# Patient Record
Sex: Male | Born: 1980 | Hispanic: Yes | Marital: Married | Smoking: Former smoker
Health system: Southern US, Community
[De-identification: ages and names within clinical notes are randomized; demographics above are authoritative.]

## PROBLEM LIST (undated history)

## (undated) DIAGNOSIS — J069 Acute upper respiratory infection, unspecified: Secondary | ICD-10-CM

## (undated) HISTORY — PX: ANKLE SURGERY: SHX546

---
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
## 2002-01-01 ENCOUNTER — Emergency Department (HOSPITAL_COMMUNITY): Admission: EM | Admit: 2002-01-01 | Discharge: 2002-01-01 | Payer: Self-pay | Admitting: Internal Medicine

## 2002-01-27 ENCOUNTER — Emergency Department (HOSPITAL_COMMUNITY): Admission: EM | Admit: 2002-01-27 | Discharge: 2002-01-27 | Payer: Self-pay | Admitting: Emergency Medicine

## 2002-11-01 ENCOUNTER — Encounter: Payer: Self-pay | Admitting: Emergency Medicine

## 2002-11-01 ENCOUNTER — Emergency Department (HOSPITAL_COMMUNITY): Admission: EM | Admit: 2002-11-01 | Discharge: 2002-11-01 | Payer: Self-pay | Admitting: Emergency Medicine

## 2002-11-02 ENCOUNTER — Ambulatory Visit (HOSPITAL_BASED_OUTPATIENT_CLINIC_OR_DEPARTMENT_OTHER): Admission: RE | Admit: 2002-11-02 | Discharge: 2002-11-02 | Payer: Self-pay | Admitting: Orthopedic Surgery

## 2004-12-28 ENCOUNTER — Emergency Department (HOSPITAL_COMMUNITY): Admission: EM | Admit: 2004-12-28 | Discharge: 2004-12-28 | Payer: Self-pay | Admitting: Emergency Medicine

## 2005-06-14 ENCOUNTER — Emergency Department (HOSPITAL_COMMUNITY): Admission: EM | Admit: 2005-06-14 | Discharge: 2005-06-14 | Payer: Self-pay | Admitting: Family Medicine

## 2005-06-14 ENCOUNTER — Ambulatory Visit (HOSPITAL_COMMUNITY): Admission: RE | Admit: 2005-06-14 | Discharge: 2005-06-14 | Payer: Self-pay | Admitting: Family Medicine

## 2006-07-16 ENCOUNTER — Emergency Department (HOSPITAL_COMMUNITY): Admission: EM | Admit: 2006-07-16 | Discharge: 2006-07-16 | Payer: Self-pay | Admitting: Emergency Medicine

## 2006-11-13 ENCOUNTER — Emergency Department (HOSPITAL_COMMUNITY): Admission: EM | Admit: 2006-11-13 | Discharge: 2006-11-13 | Payer: Self-pay | Admitting: Emergency Medicine

## 2008-04-19 ENCOUNTER — Emergency Department (HOSPITAL_COMMUNITY): Admission: EM | Admit: 2008-04-19 | Discharge: 2008-04-19 | Payer: Self-pay | Admitting: Family Medicine

## 2010-07-18 NOTE — Op Note (Signed)
NAME:  Kevin [name], Kevin [name]                     ACCOUNT NO.:  [id number]   MEDICAL RECORD NO.:  [id number]                   PATIENT TYPE:  AMB   LOCATION:  [facility]                                  FACILITY:  [facility]   PHYSICIAN:  Kevin [name], M.D.                DATE OF BIRTH:  [birth year]   DATE OF PROCEDURE:  11/02/2002  DATE OF DISCHARGE:                                 OPERATIVE REPORT   PREOPERATIVE DIAGNOSIS:  Minimally displaced right ankle bimalleolar  fracture.   POSTOPERATIVE DIAGNOSIS:  Minimally displaced right ankle bimalleolar  fracture.   PROCEDURE:  Open reduction, internal fixation using [name] single 4 mm titanium  DePuy small fragment screw medially, on the lateral side two anterior to  posterior interfragment screws, and [name] 7-hole one-third tubular plate with  three distal cancellous screws and three proximal bicortical screws.   SURGEON:  Kevin [name], M.D.   FIRST ASSISTANT:  Kevin [name], P.[name].-C.   ANESTHETIC:  General endotracheal.   ESTIMATED BLOOD LOSS:  Minimal.   FLUID REPLACEMENT:  Liter of crystalloid.   DRAINS PLACED:  None.   TOURNIQUET TIME:  40 minutes.   INDICATIONS FOR PROCEDURE:  This 30 year old man who tripped over an object  yesterday and sustained [name] minimally displaced bimalleolar right ankle  fracture was seen at [facility] emergency room, placed in [name] soft dressing  and [name] Cam Walker boot for orthopedic evaluation the next day.  I met him in  the office at 9:00 this morning.  Because it was [name] bimalleolar, and  therefore, unstable ankle fracture, he was taken for open reduction,  internal fixation in order to decrease pain and increase function.   DESCRIPTION OF PROCEDURE:  The patient was identified by arm band and taken  to the operating room at [facility].  Appropriate anesthetic  monitors were attached, and general endotracheal anesthesia induced with the  patient in the supine position.  [name] tourniquet was applied high  to the left  leg, and the left lower extremity was prepped and draped in the usual  sterile fashion from the toes to the tourniquet.  The limb was wrapped with  an Esmarch bandage.  The tourniquet was inflated to 300 mmHg, and we began  the procedure on the medial side, making [name] longitudinal incision centered  over the tip of the medial malleolus and going proximally for about 3 cm and  distally for 2 cm.  The bleeders in the skin and subcutaneous tissue were  identified and cauterized.  We were careful to protect any branches of the  saphenous nerve or vein and cut down to the bone and then split the  periosteum anteriorly and posteriorly, exposing the fracture fragment, which  was actually in two pieces.  The major piece was then cleansed with  irrigation, removing blood clot and successfully reduced and then fixed with  [name] 4 mm fully threaded cancellous lag  screw.  C-arm images confirmed the  anatomic reduction of the fragment.  We then directed our attention to the  lateral side and made [name] longitudinal incision, starting out at the tip of  the lateral malleolus and going proximally for about 10 cm, cutting through  the skin and subcutaneous tissue, being careful to avoid any major branches  of the superficial peroneal nerve or the sternal nerve.  We cut down to the  periosteum over the fibula, which was again minimally displaced, identified  the fracture, and cleansed the fracture site with normal saline pulse  solution and removed small bits of clot with [name] dental probe.  Traction was  applied to the fibula, which was then reduced with [name] lion's jaw clamp and  fixed with two anterior to posterior cortical 3.5 mm titanium screws from  the DePuy small fragments, countersinking the heads.  [name] neutralization plate  was then conformed, seven holes, to the lateral aspect of the fibula.  Three  cancellous screws were placed distally, three bicortical or cortical screws  proximally.  Firm fixation  was accomplished.  C-arm images were taken,  confirming the anatomic reduction.  The tourniquet was let down.  Small  bleeders were identified and cauterized.  The wounds were then closed with  subcutaneous 2-0 Vicryl and in the skin running interlocking 3-0 nylon  suture.  [name] dressing with Xeroform, 4 x 4 dressings, sponges, Webril, and Ace  wrap applied, followed by [name] large Cam Walker boot.  The patient was then  awakened and taken to the recovery room without difficulty.                                               Kevin [name], M.D.    Ovid Curd  D:  11/02/2002  T:  11/03/2002  Job:  [id number]

## 2010-09-05 ENCOUNTER — Encounter: Payer: Self-pay | Admitting: Family Medicine

## 2010-10-23 ENCOUNTER — Encounter (HOSPITAL_COMMUNITY): Payer: Self-pay | Admitting: Emergency Medicine

## 2010-10-23 ENCOUNTER — Emergency Department (HOSPITAL_COMMUNITY)
Admission: EM | Admit: 2010-10-23 | Discharge: 2010-10-23 | Disposition: A | Discharge status: Home / Self Care | Payer: Self-pay | Attending: Emergency Medicine | Admitting: Emergency Medicine

## 2010-10-23 DIAGNOSIS — J329 Chronic sinusitis, unspecified: Secondary | ICD-10-CM | POA: Insufficient documentation

## 2010-10-23 DIAGNOSIS — R5381 Other malaise: Secondary | ICD-10-CM | POA: Insufficient documentation

## 2010-10-23 DIAGNOSIS — R5383 Other fatigue: Secondary | ICD-10-CM | POA: Insufficient documentation

## 2010-10-23 MED ORDER — PSEUDOEPHEDRINE HCL 60 MG PO TABS
60.0000 mg | ORAL_TABLET | Freq: Once | ORAL | Status: AC
  Administered 2010-10-23: 60 mg via ORAL
  Filled 2010-10-23: qty 1

## 2010-10-23 MED ORDER — PREDNISONE 10 MG PO TABS: ORAL_TABLET | ORAL | Status: DC

## 2010-10-23 MED ORDER — DOXYCYCLINE HYCLATE 100 MG PO CAPS: ORAL_CAPSULE | ORAL | Status: DC

## 2010-10-23 MED ORDER — FEXOFENADINE-PSEUDOEPHED ER 60-120 MG PO TB12: 1.0000 | ORAL_TABLET | Freq: Two times a day (BID) | ORAL | Status: DC

## 2010-10-23 MED ORDER — DOXYCYCLINE HYCLATE 100 MG PO TABS
100.0000 mg | ORAL_TABLET | Freq: Once | ORAL | Status: AC
  Administered 2010-10-23: 100 mg via ORAL
  Filled 2010-10-23: qty 1

## 2010-10-23 MED ORDER — PREDNISONE 20 MG PO TABS
60.0000 mg | ORAL_TABLET | Freq: Once | ORAL | Status: AC
  Administered 2010-10-23: 60 mg via ORAL
  Filled 2010-10-23: qty 3

## 2010-10-23 MED ORDER — HYDROCODONE-ACETAMINOPHEN 7.5-325 MG PO TABS: 1.0000 | ORAL_TABLET | ORAL | Status: AC | PRN

## 2010-10-23 MED ORDER — ONDANSETRON HCL 4 MG PO TABS
4.0000 mg | ORAL_TABLET | Freq: Once | ORAL | Status: AC
  Administered 2010-10-23: 4 mg via ORAL
  Filled 2010-10-23: qty 1

## 2010-10-23 NOTE — ED Notes (Signed)
Patient c/o headache, nasal congestion and flu-like symptoms x 2 days.  States has been taking Nyquil at home with no relief.

## 2010-10-23 NOTE — ED Provider Notes (Signed)
History     CSN: [id number] Arrival date & time: 10/23/2010  9:24 PM  Chief Complaint  Patient presents with  . Nasal Congestion  . Emesis  . Headache  . URI   Patient is a 30 y.o. male presenting with vomiting, headaches, and URI. The history is provided by the patient.  Emesis  This is a new problem. The current episode started more than 2 days ago. The problem has not changed since onset.There has been no fever. Associated symptoms include headaches and URI. Pertinent negatives include no abdominal pain, no arthralgias and no cough.  Headache  This is a new problem. The current episode started 2 days ago. The problem occurs constantly. The problem has been gradually worsening. The pain is located in the right unilateral and frontal region. The quality of the pain is described as throbbing. The pain is moderate. Associated symptoms include malaise/fatigue and vomiting. Pertinent negatives include no near-syncope, no palpitations, no syncope and no shortness of breath. He has tried nothing for the symptoms.  URI The primary symptoms include headaches and vomiting. Primary symptoms do not include cough, wheezing, abdominal pain or arthralgias.  The headache is not associated with photophobia.  Symptoms associated with the illness include congestion.    History reviewed. No pertinent past medical history.  Past Surgical History  Procedure Date  . Ankle surgery     No family history on file.  History  Substance Use Topics  . Smoking status: Former Games developer  . Smokeless tobacco: Not on file  . Alcohol Use: No      Review of Systems  Constitutional: Positive for malaise/fatigue. Negative for activity change.       All ROS Neg except as noted in HPI  HENT: Positive for congestion and dental problem. Negative for nosebleeds and neck pain.   Eyes: Negative for photophobia and discharge.  Respiratory: Negative for cough, shortness of breath and wheezing.   Cardiovascular: Negative  for chest pain, palpitations, syncope and near-syncope.  Gastrointestinal: Positive for vomiting. Negative for abdominal pain and blood in stool.  Genitourinary: Negative for dysuria, frequency and hematuria.  Musculoskeletal: Negative for back pain and arthralgias.  Skin: Negative.   Neurological: Positive for headaches. Negative for dizziness, seizures and speech difficulty.  Psychiatric/Behavioral: Negative for hallucinations and confusion.    Physical Exam  BP 150/98  Pulse 99  Temp(Src) 98.6 F (37 C) (Oral)  Resp 20  Ht 5\' 9"  (1.753 m)  Wt 205 lb (92.987 kg)  BMI 30.27 kg/m2  SpO2 99%  Physical Exam  Nursing note and vitals reviewed. Constitutional: He is oriented to person, place, and time. He appears well-developed and well-nourished.  Non-toxic appearance.  HENT:  Head: Normocephalic.  Right Ear: Tympanic membrane and external ear normal.  Left Ear: Tympanic membrane and external ear normal.       Pain to percussion of the right frontal sinus area. Nasal congestion noted.  Eyes: EOM and lids are normal. Pupils are equal, round, and reactive to light.  Neck: Normal range of motion. Neck supple. Carotid bruit is not present.  Cardiovascular: Normal rate, regular rhythm, normal heart sounds, intact distal pulses and normal pulses.   Pulmonary/Chest: Breath sounds normal. No respiratory distress.  Abdominal: Soft. Bowel sounds are normal. There is no tenderness. There is no guarding.  Musculoskeletal: Normal range of motion.  Lymphadenopathy:       Head (right side): No submandibular adenopathy present.       Head (left side): No submandibular  adenopathy present.    He has no cervical adenopathy.  Neurological: He is alert and oriented to person, place, and time. He has normal strength. No cranial nerve deficit or sensory deficit.  Skin: Skin is warm and dry.  Psychiatric: He has a normal mood and affect. His speech is normal.    ED Course  Procedures  Plan:  Increase fluids. Tylenol for fever and aching. Doxycycline, allegra D, prednisone.      Kathie Dike, [place] 10/23/10 2156

## 2010-10-24 NOTE — ED Provider Notes (Signed)
Medical screening examination/treatment/procedure(s) were performed by non-physician practitioner and as supervising physician I was immediately available for consultation/collaboration.   Lyanne Co, MD 10/24/10 Kevin [name]

## 2011-05-07 ENCOUNTER — Other Ambulatory Visit (HOSPITAL_COMMUNITY): Payer: Self-pay | Admitting: Otolaryngology

## 2011-05-08 ENCOUNTER — Encounter (HOSPITAL_COMMUNITY)
Admission: RE | Admit: 2011-05-08 | Discharge: 2011-05-08 | Disposition: A | Discharge status: Home / Self Care | Payer: Self-pay | Source: Ambulatory Visit | Attending: Otolaryngology | Admitting: Otolaryngology

## 2011-05-08 ENCOUNTER — Encounter (HOSPITAL_COMMUNITY): Payer: Self-pay

## 2011-05-08 ENCOUNTER — Encounter (HOSPITAL_COMMUNITY): Payer: Self-pay | Admitting: Pharmacy Technician

## 2011-05-08 HISTORY — DX: Acute upper respiratory infection, unspecified: J06.9

## 2011-05-08 LAB — CBC
HCT: 42.2 % (ref 39.0–52.0)
MCV: 86.1 fL (ref 78.0–100.0)
Platelets: 301 10*3/uL (ref 150–400)
RBC: 4.9 MIL/uL (ref 4.22–5.81)
WBC: 11 10*3/uL — ABNORMAL HIGH (ref 4.0–10.5)

## 2011-05-08 NOTE — Pre-Procedure Instructions (Signed)
20 Clemence A Richter  05/08/2011   Your procedure is scheduled on:  05/11/2011  Report to [facility] at 0530 AM.  Call this number if you have problems the morning of surgery: [phone]   Remember:   Do not eat food:After Midnight.  May have clear liquids: up to 4 Hours before arrival.DO NOT DRINK ANYTHING AFTER 0130  AM THE DAY OF SURGERY.   Clear liquids include soda, tea, black coffee, apple or grape juice, broth.  Take these medicines the morning of surgery with A SIP OF WATER: NONE   Do not wear jewelry, make-up or nail polish.  Do not wear lotions, powders, or perfumes. You may wear deodorant.  Do not shave 48 hours prior to surgery.  Do not bring valuables to the hospital.  Contacts, dentures or bridgework may not be worn into surgery.  Leave suitcase in the car. After surgery it may be brought to your room.  For patients admitted to the hospital, checkout time is 11:00 AM the day of discharge.   Patients discharged the day of surgery will not be allowed to drive home.  Name and phone number of your driver: SISTER IN LAW WILL BE WITH PT ... EVA HERNANDEZ  Special Instructions: CHG Shower Use Special Wash: 1/2 bottle night before surgery and 1/2 bottle morning of surgery.   Please read over the following fact sheets that you were given: Pain Booklet, Coughing and Deep Breathing, MRSA Information and Surgical Site Infection Prevention

## 2011-05-11 ENCOUNTER — Encounter (HOSPITAL_COMMUNITY): Payer: Self-pay | Admitting: Anesthesiology

## 2011-05-11 ENCOUNTER — Encounter (HOSPITAL_COMMUNITY)
Admission: RE | Disposition: A | Discharge status: Home / Self Care | Payer: Self-pay | Source: Ambulatory Visit | Attending: Otolaryngology

## 2011-05-11 ENCOUNTER — Ambulatory Visit (HOSPITAL_COMMUNITY): Payer: Self-pay | Admitting: Anesthesiology

## 2011-05-11 ENCOUNTER — Ambulatory Visit (HOSPITAL_COMMUNITY)
Admission: RE | Admit: 2011-05-11 | Discharge: 2011-05-11 | Disposition: A | Discharge status: Home / Self Care | Payer: Self-pay | Source: Ambulatory Visit | Attending: Otolaryngology | Admitting: Otolaryngology

## 2011-05-11 ENCOUNTER — Encounter (HOSPITAL_COMMUNITY): Payer: Self-pay | Admitting: *Deleted

## 2011-05-11 DIAGNOSIS — J3501 Chronic tonsillitis: Secondary | ICD-10-CM | POA: Diagnosis present

## 2011-05-11 DIAGNOSIS — Z01812 Encounter for preprocedural laboratory examination: Secondary | ICD-10-CM | POA: Insufficient documentation

## 2011-05-11 DIAGNOSIS — J351 Hypertrophy of tonsils: Secondary | ICD-10-CM | POA: Diagnosis present

## 2011-05-11 DIAGNOSIS — F17200 Nicotine dependence, unspecified, uncomplicated: Secondary | ICD-10-CM | POA: Insufficient documentation

## 2011-05-11 HISTORY — PX: TONSILLECTOMY: SHX5217

## 2011-05-11 SURGERY — TONSILLECTOMY
Anesthesia: General | Wound class: Clean Contaminated

## 2011-05-11 MED ORDER — SUFENTANIL CITRATE 50 MCG/ML IV SOLN
INTRAVENOUS | Status: DC | PRN
  Administered 2011-05-11: 5 ug via INTRAVENOUS
  Administered 2011-05-11: 15 ug via INTRAVENOUS
  Administered 2011-05-11: 5 ug via INTRAVENOUS

## 2011-05-11 MED ORDER — DEXAMETHASONE SODIUM PHOSPHATE 10 MG/ML IJ SOLN
INTRAMUSCULAR | Status: DC | PRN
  Administered 2011-05-11: 10 mg via INTRAVENOUS

## 2011-05-11 MED ORDER — HYDROMORPHONE HCL PF 1 MG/ML IJ SOLN
0.2500 mg | INTRAMUSCULAR | Status: DC | PRN
Start: 2011-05-11 — End: 2011-05-11

## 2011-05-11 MED ORDER — SODIUM CHLORIDE 0.9 % IR SOLN
Status: DC | PRN
  Administered 2011-05-11: 1000 mL

## 2011-05-11 MED ORDER — MEPERIDINE HCL 25 MG/ML IJ SOLN: 6.2500 mg | INTRAMUSCULAR | Status: DC | PRN

## 2011-05-11 MED ORDER — LACTATED RINGERS IV SOLN
INTRAVENOUS | Status: DC | PRN
  Administered 2011-05-11: 07:00:00 via INTRAVENOUS

## 2011-05-11 MED ORDER — HYDROCODONE-ACETAMINOPHEN 7.5-500 MG/15ML PO SOLN
ORAL | Status: AC
  Filled 2011-05-11: qty 15

## 2011-05-11 MED ORDER — PROMETHAZINE HCL 25 MG/ML IJ SOLN: 6.2500 mg | INTRAMUSCULAR | Status: DC | PRN

## 2011-05-11 MED ORDER — LIDOCAINE HCL (CARDIAC) 20 MG/ML IV SOLN
INTRAVENOUS | Status: DC | PRN
  Administered 2011-05-11: 80 mg via INTRAVENOUS

## 2011-05-11 MED ORDER — ONDANSETRON HCL 4 MG/2ML IJ SOLN
INTRAMUSCULAR | Status: DC | PRN
  Administered 2011-05-11: 4 mg via INTRAVENOUS

## 2011-05-11 MED ORDER — ROCURONIUM BROMIDE 100 MG/10ML IV SOLN
INTRAVENOUS | Status: DC | PRN
  Administered 2011-05-11: 35 mg via INTRAVENOUS

## 2011-05-11 MED ORDER — HYDROCODONE-ACETAMINOPHEN 7.5-500 MG/15ML PO SOLN
10.0000 mL | ORAL | Status: DC | PRN
  Administered 2011-05-11 (×2): 15 mL via ORAL

## 2011-05-11 MED ORDER — GLYCOPYRROLATE 0.2 MG/ML IJ SOLN
INTRAMUSCULAR | Status: DC | PRN
  Administered 2011-05-11: .8 mg via INTRAVENOUS

## 2011-05-11 MED ORDER — PROPOFOL 10 MG/ML IV EMUL
INTRAVENOUS | Status: DC | PRN
  Administered 2011-05-11: 200 mg via INTRAVENOUS

## 2011-05-11 MED ORDER — HYDROCODONE-ACETAMINOPHEN 7.5-500 MG/15ML PO SOLN: 10.0000 mL | ORAL | Status: AC | PRN

## 2011-05-11 MED ORDER — MIDAZOLAM HCL 5 MG/5ML IJ SOLN
INTRAMUSCULAR | Status: DC | PRN
  Administered 2011-05-11: 1 mg via INTRAVENOUS

## 2011-05-11 MED ORDER — NEOSTIGMINE METHYLSULFATE 1 MG/ML IJ SOLN
INTRAMUSCULAR | Status: DC | PRN
  Administered 2011-05-11: 5 mg via INTRAVENOUS

## 2011-05-11 SURGICAL SUPPLY — 32 items
CANISTER SUCTION 2500CC (MISCELLANEOUS) ×2 IMPLANT
CATH ROBINSON RED A/P 10FR (CATHETERS) IMPLANT
CLEANER TIP ELECTROSURG 2X2 (MISCELLANEOUS) IMPLANT
CLOTH BEACON ORANGE TIMEOUT ST (SAFETY) ×2 IMPLANT
COAGULATOR SUCT SWTCH 10FR 6 (ELECTROSURGICAL) ×2 IMPLANT
CRADLE DONUT ADULT HEAD (MISCELLANEOUS) ×2 IMPLANT
ELECT COATED BLADE 2.86 ST (ELECTRODE) ×2 IMPLANT
ELECT REM PT RETURN 9FT ADLT (ELECTROSURGICAL) ×2
ELECT REM PT RETURN 9FT PED (ELECTROSURGICAL)
ELECTRODE REM PT RETRN 9FT PED (ELECTROSURGICAL) IMPLANT
ELECTRODE REM PT RTRN 9FT ADLT (ELECTROSURGICAL) ×1 IMPLANT
GAUZE SPONGE 4X4 16PLY XRAY LF (GAUZE/BANDAGES/DRESSINGS) ×2 IMPLANT
GLOVE BIO SURGEON STRL SZ7.5 (GLOVE) ×2 IMPLANT
GLOVE ECLIPSE 6.5 STRL STRAW (GLOVE) ×2 IMPLANT
GOWN STRL NON-REIN LRG LVL3 (GOWN DISPOSABLE) ×4 IMPLANT
KIT BASIN OR (CUSTOM PROCEDURE TRAY) ×2 IMPLANT
KIT ROOM TURNOVER OR (KITS) ×2 IMPLANT
NS IRRIG 1000ML POUR BTL (IV SOLUTION) ×2 IMPLANT
PACK SURGICAL SETUP 50X90 (CUSTOM PROCEDURE TRAY) ×2 IMPLANT
PAD ARMBOARD 7.5X6 YLW CONV (MISCELLANEOUS) ×4 IMPLANT
PENCIL BUTTON HOLSTER BLD 10FT (ELECTRODE) ×2 IMPLANT
SPECIMEN JAR SMALL (MISCELLANEOUS) IMPLANT
SPONGE TONSIL 1.25 RF SGL STRG (GAUZE/BANDAGES/DRESSINGS) ×2 IMPLANT
SYR BULB 3OZ (MISCELLANEOUS) ×2 IMPLANT
TOWEL OR 17X24 6PK STRL BLUE (TOWEL DISPOSABLE) ×4 IMPLANT
TUBE CONNECTING 12X1/4 (SUCTIONS) ×2 IMPLANT
TUBE SALEM SUMP 10F W/ARV (TUBING) IMPLANT
TUBE SALEM SUMP 12R W/ARV (TUBING) IMPLANT
TUBE SALEM SUMP 14F W/ARV (TUBING) ×2 IMPLANT
TUBE SALEM SUMP 16 FR W/ARV (TUBING) IMPLANT
WATER STERILE IRR 1000ML POUR (IV SOLUTION) ×2 IMPLANT
YANKAUER SUCT BULB TIP NO VENT (SUCTIONS) ×2 IMPLANT
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
## 2011-05-11 NOTE — Anesthesia Preprocedure Evaluation (Addendum)
Anesthesia Evaluation  Patient identified by MRN, date of birth, ID band Patient awake    Reviewed: Allergy & Precautions, H&P , NPO status , Patient's Chart, lab work & pertinent test results, reviewed documented beta blocker date and time   Airway Mallampati: I TM Distance: >3 FB Neck ROM: Full    Dental  (+) Teeth Intact and Chipped   Pulmonary Recent URI , Resolved,  breath sounds clear to auscultation        Cardiovascular Rhythm:Regular Rate:Normal     Neuro/Psych    GI/Hepatic   Endo/Other    Renal/GU      Musculoskeletal   Abdominal   Peds  Hematology   Anesthesia Other Findings Two incisors chipped  Reproductive/Obstetrics                        Anesthesia Physical Anesthesia Plan  ASA: I  Anesthesia Plan: General   Post-op Pain Management:    Induction: Intravenous  Airway Management Planned: Oral ETT  Additional Equipment:   Intra-op Plan:   Post-operative Plan: Extubation in OR  Informed Consent: I have reviewed the patients History and Physical, chart, labs and discussed the procedure including the risks, benefits and alternatives for the proposed anesthesia with the patient or authorized representative who has indicated his/her understanding and acceptance.     Plan Discussed with: CRNA and Surgeon  Anesthesia Plan Comments:         Anesthesia Quick Evaluation

## 2011-05-11 NOTE — Preoperative (Signed)
Beta Blockers   Reason not to administer Beta Blockers:Not Applicable 

## 2011-05-11 NOTE — OR Nursing (Signed)
HAS JUST COMPLETED ENTIRE CLEAR LIQUID TRAY + OTHER CLEARS WITHOUT PROBLEMS. oob X 2 TODAY TO VOID. PAIN IS RELIABLY RELIEVED WITH ORAL HYDROCODONE  SOLN.  REMAINS OFF MONITORS.

## 2011-05-11 NOTE — Anesthesia Postprocedure Evaluation (Signed)
  Anesthesia Post-op Note  Patient: Kevin [name]  Procedure(s) Performed: Procedure(s) (LRB): TONSILLECTOMY (N/A)  Patient Location: PACU  Anesthesia Type: General  Level of Consciousness: awake  Airway and Oxygen Therapy: Patient Spontanous Breathing  Post-op Pain: mild  Post-op Assessment: Post-op Vital signs reviewed  Post-op Vital Signs: stable  Complications: No apparent anesthesia complications

## 2011-05-11 NOTE — Brief Op Note (Signed)
05/11/2011  8:07 AM  PATIENT:  Kevin [name]  31 y.o. male  PRE-OPERATIVE DIAGNOSIS:  Chronic Tonsillitis, tonsillar hypertrophy  POST-OPERATIVE DIAGNOSIS:  Chronic Tonsillitis, tonsillar hypertrophy  PROCEDURE:  Procedure(s) (LRB): TONSILLECTOMY (N/A)  SURGEON:  Surgeon(s) and Role:    * Christia Reading, MD - Primary  PHYSICIAN ASSISTANT:   ASSISTANTS: none   ANESTHESIA:   general  EBL:     BLOOD ADMINISTERED:none  DRAINS: none   LOCAL MEDICATIONS USED:  NONE  SPECIMEN:  Source of Specimen:  Right and left tonsils  DISPOSITION OF SPECIMEN:  PATHOLOGY  COUNTS:  YES  TOURNIQUET:  * No tourniquets in log *  DICTATION: .Other Dictation: Dictation Number [id number]  PLAN OF CARE: Admit for overnight observation  PATIENT DISPOSITION:  PACU - hemodynamically stable.   Delay start of Pharmacological VTE agent (>24hrs) due to surgical blood loss or risk of bleeding: no

## 2011-05-11 NOTE — Discharge Summary (Signed)
Physician Discharge Summary  Patient ID: Kevin [name] MRN: [id number] DOB/AGE: 06/03/1980 30 y.o.  Admit date: 05/11/2011 Discharge date: 05/11/2011  Admission Diagnoses: Chronic tonsillitis, tonsillar hypertrophy  Discharge Diagnoses:  Principal Problem:  *Chronic tonsillitis Active Problems:  Hypertrophy of tonsil   Discharged Condition: good  Hospital Course: 31 year old underwent tonsillectomy for above diagnoses.  See operative note.  Was observed for several hours after surgery in the PACU and was drinking easily with pain well-controlled.  Felt stable for discharge home.  Consults: None  Significant Diagnostic Studies: None  Treatments: IV hydration  Discharge Exam: Blood pressure 140/100, pulse 61, temperature 97.6 F (36.4 C), temperature source Oral, resp. rate 10, SpO2 100.00%. General appearance: alert, cooperative and no distress Throat: no bleeding  Disposition: 01-Home or Self Care  Discharge Orders    Future Orders Please Complete By Expires   Diet - low sodium heart healthy      Increase activity slowly      Discharge instructions      Comments:   Drink plenty of fluids.  Use pain medicine to help control pain.  Call if you cannot drink, have bleeding from your throat, or have high fever.     Medication List  As of 05/11/2011  1:40 PM   TAKE these medications         HYDROcodone-acetaminophen 7.5-500 MG/15ML solution   Commonly known as: LORTAB   Take 10-15 mLs by mouth every 4 (four) hours as needed.           Follow-up Information    Follow up with Ziyon Soltau, MD. Schedule an appointment as soon as possible for a visit in 4 weeks.   Contact information:   [facility] [address] [place] [address] [phone]          Signed: Christia Reading 05/11/2011, 1:40 PM

## 2011-05-11 NOTE — Op Note (Signed)
NAMEBRAYAN, [name] NO.:  [id number]  MEDICAL RECORD NO.:  [id number]  LOCATION:  [facility]                         FACILITY:  [facility]  PHYSICIAN:  Antony Contras, MD     DATE OF BIRTH:  1980-12-14  DATE OF PROCEDURE:  05/11/2011 DATE OF DISCHARGE:  05/11/2011                              OPERATIVE REPORT   PREOPERATIVE DIAGNOSIS:  Chronic tonsillitis and tonsillar hypertrophy.  POSTOPERATIVE DIAGNOSIS:  Chronic tonsillitis and tonsillar hypertrophy.  PROCEDURE:  Tonsillectomy.  SURGEON:  Excell Seltzer. Jenne Pane, M.D.  ANESTHESIA:  General endotracheal anesthesia.  COMPLICATIONS:  None.  INDICATION:  The patient is a 31 year old male who has a long history of tonsil enlargement causing obstructive symptoms as well as frequent throat pain.  He presents to the operating for surgical management.  FINDINGS:  Tonsils are 4+.  DESCRIPTION OF PROCEDURE:  The patient was identified in the holding room and an informed consent having been obtained after discussion of risks, benefits, alternatives, the patient was brought to the operative suite and put on the operative table in supine position.  Anesthesia was induced and the patient was intubated by the anesthesia team without difficulty.  The patient was given intravenous steroids during the case. The eyes were taped, closed, and bed was turned 90 degrees from Anesthesia.  A head wrap was placed around the patient's head and a Crowe-Davis retractor was inserted in the mouth and opened via the oropharynx.  This was placed in suspension on the Mayo stand.  The right tonsil was grasped with a curved Allis and retracted medially while a curvilinear incision was made along the anterior tonsillar pillar using Bovie electrocautery on a setting of 30.  Dissection continued in the subcapsular plane until the tonsil was removed.  The same procedure was then carried out on the left side.  Tonsils were passed separately for Pathology.   Bleeding was then controlled in each site using suction cautery on a setting of 30.  After this was completed, the nose and throat were copiously irrigated with saline and a flexible catheter was passed down the esophagus to suck at the stomach and esophagus.  Retractor was then taken out of suspension and removed from the patient's mouth.  He was turned back to Anesthesia for wake up.  He was extubated and moved to recovery room in stable condition.     Antony Contras, MD     DDB/MEDQ  D:  05/11/2011  T:  05/11/2011  Job:  [id number]

## 2011-05-11 NOTE — Transfer of Care (Signed)
Immediate Anesthesia Transfer of Care Note  Patient: Kevin [name]  Procedure(s) Performed: Procedure(s) (LRB): TONSILLECTOMY (N/A)  Patient Location: PACU  Anesthesia Type: General  Level of Consciousness: awake, alert , oriented and patient cooperative  Airway & Oxygen Therapy: Patient Spontanous Breathing and Patient connected to nasal cannula oxygen  Post-op Assessment: Report given to PACU RN and Post -op Vital signs reviewed and stable  Post vital signs: Reviewed and stable  Complications: No apparent anesthesia complications

## 2011-05-11 NOTE — Progress Notes (Signed)
Report to [employer] as caregiver

## 2011-05-18 ENCOUNTER — Encounter (HOSPITAL_COMMUNITY): Payer: Self-pay | Admitting: Otolaryngology

## 2014-04-18 ENCOUNTER — Encounter (HOSPITAL_COMMUNITY): Payer: Self-pay | Admitting: Emergency Medicine

## 2014-04-18 ENCOUNTER — Emergency Department (HOSPITAL_COMMUNITY)
Admission: EM | Admit: 2014-04-18 | Discharge: 2014-04-19 | Discharge status: Against Medical Advice | Payer: Self-pay | Attending: Emergency Medicine | Admitting: Emergency Medicine

## 2014-04-18 DIAGNOSIS — Y998 Other external cause status: Secondary | ICD-10-CM | POA: Insufficient documentation

## 2014-04-18 DIAGNOSIS — S0990XA Unspecified injury of head, initial encounter: Secondary | ICD-10-CM | POA: Insufficient documentation

## 2014-04-18 DIAGNOSIS — Y9389 Activity, other specified: Secondary | ICD-10-CM | POA: Insufficient documentation

## 2014-04-18 DIAGNOSIS — Y92410 Unspecified street and highway as the place of occurrence of the external cause: Secondary | ICD-10-CM | POA: Insufficient documentation

## 2014-04-18 NOTE — ED Notes (Signed)
Pt was called back to room and there was no answer.

## 2014-04-18 NOTE — ED Notes (Signed)
Pt called and no answer.  
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
## 2014-04-19 NOTE — ED Notes (Signed)
Pt was called but no answer.

## 2014-06-27 ENCOUNTER — Other Ambulatory Visit (HOSPITAL_COMMUNITY): Payer: Self-pay | Admitting: Orthopaedic Surgery

## 2014-06-27 DIAGNOSIS — M25532 Pain in left wrist: Secondary | ICD-10-CM

## 2014-07-13 ENCOUNTER — Ambulatory Visit (HOSPITAL_COMMUNITY)
Admission: RE | Admit: 2014-07-13 | Discharge: 2014-07-13 | Disposition: A | Discharge status: Home / Self Care | Payer: Self-pay | Source: Ambulatory Visit | Attending: Orthopaedic Surgery | Admitting: Orthopaedic Surgery

## 2014-07-13 DIAGNOSIS — M25532 Pain in left wrist: Secondary | ICD-10-CM

## 2014-07-13 MED ORDER — LIDOCAINE HCL (PF) 1 % IJ SOLN
INTRAMUSCULAR | Status: AC
  Filled 2014-07-13: qty 5

## 2014-07-13 MED ORDER — IOHEXOL 180 MG/ML  SOLN
20.0000 mL | Freq: Once | INTRAMUSCULAR | Status: AC | PRN
  Administered 2014-07-13: 3 mL via INTRA_ARTICULAR

## 2014-07-13 MED ORDER — GADOBENATE DIMEGLUMINE 529 MG/ML IV SOLN
5.0000 mL | Freq: Once | INTRAVENOUS | Status: AC | PRN
  Administered 2014-07-13: 0.05 mL via INTRAVENOUS

## 2015-04-13 ENCOUNTER — Emergency Department (HOSPITAL_COMMUNITY): Payer: No Typology Code available for payment source

## 2015-04-13 ENCOUNTER — Encounter (HOSPITAL_COMMUNITY): Payer: Self-pay

## 2015-04-13 ENCOUNTER — Emergency Department (HOSPITAL_COMMUNITY)
Admission: EM | Admit: 2015-04-13 | Discharge: 2015-04-13 | Disposition: A | Discharge status: Home / Self Care | Payer: No Typology Code available for payment source | Attending: Emergency Medicine | Admitting: Emergency Medicine

## 2015-04-13 DIAGNOSIS — F17210 Nicotine dependence, cigarettes, uncomplicated: Secondary | ICD-10-CM | POA: Diagnosis not present

## 2015-04-13 DIAGNOSIS — S022XXA Fracture of nasal bones, initial encounter for closed fracture: Secondary | ICD-10-CM | POA: Diagnosis not present

## 2015-04-13 DIAGNOSIS — Y998 Other external cause status: Secondary | ICD-10-CM | POA: Insufficient documentation

## 2015-04-13 DIAGNOSIS — S0083XA Contusion of other part of head, initial encounter: Secondary | ICD-10-CM

## 2015-04-13 DIAGNOSIS — Y9389 Activity, other specified: Secondary | ICD-10-CM | POA: Diagnosis not present

## 2015-04-13 DIAGNOSIS — S6991XA Unspecified injury of right wrist, hand and finger(s), initial encounter: Secondary | ICD-10-CM | POA: Diagnosis not present

## 2015-04-13 DIAGNOSIS — S20211A Contusion of right front wall of thorax, initial encounter: Secondary | ICD-10-CM

## 2015-04-13 DIAGNOSIS — S50812A Abrasion of left forearm, initial encounter: Secondary | ICD-10-CM | POA: Insufficient documentation

## 2015-04-13 DIAGNOSIS — S20221A Contusion of right back wall of thorax, initial encounter: Secondary | ICD-10-CM | POA: Insufficient documentation

## 2015-04-13 DIAGNOSIS — S80211A Abrasion, right knee, initial encounter: Secondary | ICD-10-CM | POA: Insufficient documentation

## 2015-04-13 DIAGNOSIS — Z23 Encounter for immunization: Secondary | ICD-10-CM | POA: Insufficient documentation

## 2015-04-13 DIAGNOSIS — S8001XA Contusion of right knee, initial encounter: Secondary | ICD-10-CM

## 2015-04-13 DIAGNOSIS — S00431A Contusion of right ear, initial encounter: Secondary | ICD-10-CM | POA: Diagnosis not present

## 2015-04-13 DIAGNOSIS — Y9289 Other specified places as the place of occurrence of the external cause: Secondary | ICD-10-CM | POA: Insufficient documentation

## 2015-04-13 DIAGNOSIS — Z8709 Personal history of other diseases of the respiratory system: Secondary | ICD-10-CM | POA: Diagnosis not present

## 2015-04-13 DIAGNOSIS — S0992XA Unspecified injury of nose, initial encounter: Secondary | ICD-10-CM | POA: Diagnosis present

## 2015-04-13 MED ORDER — AMOXICILLIN 500 MG PO CAPS: 500.0000 mg | ORAL_CAPSULE | Freq: Three times a day (TID) | ORAL | Status: DC

## 2015-04-13 MED ORDER — FENTANYL CITRATE (PF) 100 MCG/2ML IJ SOLN
50.0000 ug | Freq: Once | INTRAMUSCULAR | Status: AC
  Administered 2015-04-13: 50 ug via INTRAVENOUS
  Filled 2015-04-13: qty 2

## 2015-04-13 MED ORDER — NAPROXEN 500 MG PO TABS: ORAL_TABLET | ORAL | Status: DC

## 2015-04-13 MED ORDER — HYDROCODONE-ACETAMINOPHEN 5-325 MG PO TABS: 1.0000 | ORAL_TABLET | Freq: Four times a day (QID) | ORAL | Status: DC | PRN

## 2015-04-13 MED ORDER — TETANUS-DIPHTH-ACELL PERTUSSIS 5-2.5-18.5 LF-MCG/0.5 IM SUSP
0.5000 mL | Freq: Once | INTRAMUSCULAR | Status: AC
  Administered 2015-04-13: 0.5 mL via INTRAMUSCULAR
  Filled 2015-04-13: qty 0.5
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
## 2015-04-13 NOTE — Discharge Instructions (Signed)
Ice packs to the bruised, swollen areas. Take the antibiotics until gone. Take the medications for pain  (hydrocodone and naproxen). You should have an ENT see you early this week, I gave you the names of two ENT specialists. You have a fracture or broken nose and a contusion of your right ear lobe. Return to the ED if you have any problems listed on the head injury sheet.
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
## 2017-07-01 ENCOUNTER — Encounter (HOSPITAL_COMMUNITY): Payer: Self-pay | Admitting: Family Medicine

## 2017-07-01 ENCOUNTER — Ambulatory Visit (HOSPITAL_COMMUNITY)
Admission: EM | Admit: 2017-07-01 | Discharge: 2017-07-01 | Disposition: A | Discharge status: Home / Self Care | Payer: Self-pay | Attending: Family Medicine | Admitting: Family Medicine

## 2017-07-01 DIAGNOSIS — L255 Unspecified contact dermatitis due to plants, except food: Secondary | ICD-10-CM

## 2017-07-01 MED ORDER — PREDNISONE 10 MG (48) PO TBPK: ORAL_TABLET | ORAL | 0 refills | Status: DC

## 2017-07-01 NOTE — ED Triage Notes (Signed)
Pt here for allergic reaction to poison ivy. It is spreading all over. Redness and swelling to face. He has been using allergy meds and a lotion but not better.

## 2017-07-05 NOTE — ED Provider Notes (Signed)
[facility]   [id number] 07/01/17 Arrival Time: 1359  ASSESSMENT & PLAN:  1. Rhus dermatitis     Meds ordered this encounter  Medications  . predniSONE (STERAPRED UNI-PAK 48 TAB) 10 MG (48) TBPK tablet    Sig: Take as directed.    Dispense:  48 tablet    Refill:  0   OTC as needed. Will follow up with PCP or here if worsening or failing to improve as anticipated. Reviewed expectations re: course of current medical issues. Questions answered. Outlined signs and symptoms indicating need for more acute intervention. Patient verbalized understanding. After Visit Summary given.   SUBJECTIVE:  Kevin [name] is a 37 y.o. male who presents with a skin complaint.   Location: diffuse but mainly on extremities; upper mostly; some to face Onset: gradual Duration: for the past few days Pruritic? Yes Painful? No Progression: increasing steadily  Drainage? No  Known trigger? questions poison ivy Contacts with similar? No Recent travel? No  Other associated symptoms: none Therapies tried thus far: none Denies fever. No specific aggravating or alleviating factors reported.  ROS: As per HPI.  OBJECTIVE: Vitals:   07/01/17 1432  BP: 117/78  Pulse: 72  Resp: 18  Temp: 98.1 F (36.7 C)  SpO2: 97%    General appearance: alert; no distress Lungs: clear to auscultation bilaterally Heart: regular rate and rhythm Extremities: no edema Skin: warm and dry; areas of linear papules and vesicles with surrounding erythema over extremities and face; no sign of infection Psychological: alert and cooperative; normal mood and affect  No Known Allergies  Past Medical History:  Diagnosis Date  . Recurrent upper respiratory infection (URI)    Social History   Socioeconomic History  . Marital status: Married    Spouse name: Not on file  . Number of children: Not on file  . Years of education: Not on file  . Highest education level: Not on file  Occupational History  .  Not on file  Social Needs  . Financial resource strain: Not on file  . Food insecurity:    Worry: Not on file    Inability: Not on file  . Transportation needs:    Medical: Not on file    Non-medical: Not on file  Tobacco Use  . Smoking status: Current Some Day Smoker    Types: Cigarettes  Substance and Sexual Activity  . Alcohol use: No    Comment: 10 YRS AGO  . Drug use: Yes    Types: Marijuana  . Sexual activity: Yes  Lifestyle  . Physical activity:    Days per week: Not on file    Minutes per session: Not on file  . Stress: Not on file  Relationships  . Social connections:    Talks on phone: Not on file    Gets together: Not on file    Attends religious service: Not on file    Active member of club or organization: Not on file    Attends meetings of clubs or organizations: Not on file    Relationship status: Not on file  . Intimate partner violence:    Fear of current or ex partner: Not on file    Emotionally abused: Not on file    Physically abused: Not on file    Forced sexual activity: Not on file  Other Topics Concern  . Not on file  Social History Narrative  . Not on file   History reviewed. No pertinent family history. Past Surgical  History:  Procedure Laterality Date  . ANKLE SURGERY    . FRACTURE SURGERY  2003   PLATE IN RIGHT ANKLE--FX  . TONSILLECTOMY  05/11/2011   Procedure: TONSILLECTOMY;  Surgeon: Christia Reading, MD;  Location: [facility] OR;  Service: ENT;  Laterality: N/A;     Mardella Layman, MD 07/05/17 [phone]

## 2017-08-27 ENCOUNTER — Ambulatory Visit: Payer: Self-pay | Attending: Nurse Practitioner | Admitting: Nurse Practitioner

## 2017-08-27 ENCOUNTER — Encounter: Payer: Self-pay | Admitting: Nurse Practitioner

## 2017-08-27 VITALS — BP 125/78 | HR 70 | Temp 98.7°F | Ht 74.0 in | Wt 234.8 lb

## 2017-08-27 DIAGNOSIS — F419 Anxiety disorder, unspecified: Secondary | ICD-10-CM | POA: Insufficient documentation

## 2017-08-27 DIAGNOSIS — K5904 Chronic idiopathic constipation: Secondary | ICD-10-CM | POA: Insufficient documentation

## 2017-08-27 DIAGNOSIS — Z0000 Encounter for general adult medical examination without abnormal findings: Secondary | ICD-10-CM

## 2017-08-27 DIAGNOSIS — Z833 Family history of diabetes mellitus: Secondary | ICD-10-CM | POA: Insufficient documentation

## 2017-08-27 MED ORDER — DOCUSATE SODIUM 50 MG PO CAPS: 50.0000 mg | ORAL_CAPSULE | Freq: Two times a day (BID) | ORAL | 1 refills | Status: DC

## 2017-08-27 NOTE — Progress Notes (Signed)
Assessment & Plan:  Kevin [name] was seen today for new patient (initial visit).  Diagnoses and all orders for this visit:  Chronic idiopathic constipation -     docusate sodium (COLACE) 50 MG capsule; Take 1 capsule (50 mg total) by mouth 2 (two) times daily. -     TSH  Family history of diabetes mellitus in mother -     Hemoglobin A1c  Routine adult health maintenance -     CBC -     CMP14+EGFR -     Lipid panel  Anxiety -     TSH    Patient has been counseled on age-appropriate routine health concerns for screening and prevention. These are reviewed and up-to-date. Referrals have been placed accordingly. Immunizations are up-to-date or declined.    Subjective:   Chief Complaint  Patient presents with  . New Patient (Initial Visit)    Pt. stated when he feel too excited, he feels like he going to fall.    HPI Kevin [name] 37 y.o. male presents to office today to establish care. He has complaints of anxiety and constipation.   Anxiety Patient complains of anxiety disorder and panic attacks.  He has the following symptoms: dizziness, feelings of losing control. Onset of symptoms was approximately unknown. Episodes have occurred twice.  Stable since that time. He denies current suicidal and homicidal ideation. Family history significant for no psychiatric illnessPossible organic causes contributing are: none. Risk factors: none Previous treatment includes NONE. He reports symptoms have been somewhat stable as he has implemented coping strategies to help with his anxiety. No medications (SSRI or anxiolytic) will be initiated at this time.  GAD 7 : Generalized Anxiety Score 08/27/2017  Nervous, Anxious, on Edge 0  Control/stop worrying 0  Worry too much - different things 0  Trouble relaxing 0  Restless 0  Easily annoyed or irritable 0  Afraid - awful might happen 0  Total GAD 7 Score 0     Constipation Patient complains of constipation.  Stool pattern has been 1 firm  stool(s) per day. Onset was an  unknown length of time. Defecation has been difficult. Co-Morbid conditions:none. Symptoms have been symptoms have progressed to a point and plateaued. Current Health Habits: Eating fiber? no Exercise?no Water intake? insufficient Current OTC/RX therapy has been NOTHING    Review of Systems  Constitutional: Negative for fever, malaise/fatigue and weight loss.  HENT: Negative.  Negative for nosebleeds.   Eyes: Negative.  Negative for blurred vision, double vision and photophobia.  Respiratory: Negative.  Negative for cough and shortness of breath.   Cardiovascular: Negative.  Negative for chest pain, palpitations and leg swelling.  Gastrointestinal: Positive for constipation. Negative for heartburn, nausea and vomiting.  Musculoskeletal: Negative.  Negative for myalgias.  Neurological: Negative.  Negative for dizziness, focal weakness, seizures and headaches.  Psychiatric/Behavioral: Negative for suicidal ideas. The patient is nervous/anxious.     Past Medical History:  Diagnosis Date  . Recurrent upper respiratory infection (URI)     Past Surgical History:  Procedure Laterality Date  . ANKLE SURGERY    . FRACTURE SURGERY  2003   PLATE IN RIGHT ANKLE--FX  . TONSILLECTOMY  05/11/2011   Procedure: TONSILLECTOMY;  Surgeon: Melida Quitter, MD;  Location: [facility] OR;  Service: ENT;  Laterality: N/A;    Family History  Problem Relation Age of Onset  . Diabetes Mother     Social History Reviewed with no changes to be made today.   Outpatient Medications Prior  to Visit  Medication Sig Dispense Refill  . naproxen (NAPROSYN) 500 MG tablet Take 1 po BID with food prn pain (Patient not taking: Reported on 08/27/2017) 30 tablet 0  . predniSONE (STERAPRED UNI-PAK 48 TAB) 10 MG (48) TBPK tablet Take as directed. (Patient not taking: Reported on 08/27/2017) 48 tablet 0   No facility-administered medications prior to visit.     No Known Allergies     Objective:      BP 125/78 (BP Location: Left Arm, Patient Position: Sitting, Cuff Size: Large)   Pulse 70   Temp 98.7 F (37.1 C) (Oral)   Ht _0  (1.88 m)   Wt 234 lb 12.8 oz (106.5 kg)   SpO2 95%   BMI 30.15 kg/m  Wt Readings from Last 3 Encounters:  08/27/17 234 lb 12.8 oz (106.5 kg)  04/13/15 228 lb (103.4 kg)  05/08/11 223 lb 11.2 oz (101.5 kg)    Physical Exam  Constitutional: He is oriented to person, place, and time. He appears well-developed and well-nourished. He is cooperative.  HENT:  Head: Normocephalic and atraumatic.  Eyes: EOM are normal.  Neck: Normal range of motion.  Cardiovascular: Normal rate, regular rhythm and normal heart sounds. Exam reveals no gallop and no friction rub.  No murmur heard. Pulmonary/Chest: Effort normal and breath sounds normal. No tachypnea. No respiratory distress. He has no decreased breath sounds. He has no wheezes. He has no rhonchi. He has no rales. He exhibits no tenderness.  Abdominal: Soft. Bowel sounds are normal.  Musculoskeletal: Normal range of motion. He exhibits no edema.  Neurological: He is alert and oriented to person, place, and time. Coordination normal.  Skin: Skin is warm and dry.  Psychiatric: He has a normal mood and affect. His behavior is normal. Judgment and thought content normal.  Nursing note and vitals reviewed.      Patient has been counseled extensively about nutrition and exercise as well as the importance of adherence with medications and regular follow-up. The patient was given clear instructions to go to ER or return to medical center if symptoms don't improve, worsen or new problems develop. The patient verbalized understanding.   Follow-up: Return for Physical ONLY no labs, Needs appointment with financial representative.Gildardo Pounds, FNP-BC [facility] [place], [place]   08/27/2017, 1:18 PM

## 2017-08-27 NOTE — Patient Instructions (Signed)
Generalized Anxiety Disorder, Adult Generalized anxiety disorder (GAD) is a mental health disorder. People with this condition constantly worry about everyday events. Unlike normal anxiety, worry related to GAD is not triggered by a specific event. These worries also do not fade or get better with time. GAD interferes with life functions, including relationships, work, and school. GAD can vary from mild to severe. People with severe GAD can have intense waves of anxiety with physical symptoms (panic attacks). What are the causes? The exact cause of GAD is not known. What increases the risk? This condition is more likely to develop in:  Women.  People who have a family history of anxiety disorders.  People who are very shy.  People who experience very stressful life events, such as the death of a loved one.  People who have a very stressful family environment.  What are the signs or symptoms? People with GAD often worry excessively about many things in their lives, such as their health and family. They may also be overly concerned about:  Doing well at work.  Being on time.  Natural disasters.  Friendships.  Physical symptoms of GAD include:  Fatigue.  Muscle tension or having muscle twitches.  Trembling or feeling shaky.  Being easily startled.  Feeling like your heart is pounding or racing.  Feeling out of breath or like you cannot take a deep breath.  Having trouble falling asleep or staying asleep.  Sweating.  Nausea, diarrhea, or irritable bowel syndrome (IBS).  Headaches.  Trouble concentrating or remembering facts.  Restlessness.  Irritability.  How is this diagnosed? Your health care provider can diagnose GAD based on your symptoms and medical history. You will also have a physical exam. The health care provider will ask specific questions about your symptoms, including how severe they are, when they started, and if they come and go. Your health care  provider may ask you about your use of alcohol or drugs, including prescription medicines. Your health care provider may refer you to a mental health specialist for further evaluation. Your health care provider will do a thorough examination and may perform additional tests to rule out other possible causes of your symptoms. To be diagnosed with GAD, a person must have anxiety that:  Is out of his or her control.  Affects several different aspects of his or her life, such as work and relationships.  Causes distress that makes him or her unable to take part in normal activities.  Includes at least three physical symptoms of GAD, such as restlessness, fatigue, trouble concentrating, irritability, muscle tension, or sleep problems.  Before your health care provider can confirm a diagnosis of GAD, these symptoms must be present more days than they are not, and they must last for six months or longer. How is this treated? The following therapies are usually used to treat GAD:  Medicine. Antidepressant medicine is usually prescribed for long-term daily control. Antianxiety medicines may be added in severe cases, especially when panic attacks occur.  Talk therapy (psychotherapy). Certain types of talk therapy can be helpful in treating GAD by providing support, education, and guidance. Options include: ? Cognitive behavioral therapy (CBT). People learn coping skills and techniques to ease their anxiety. They learn to identify unrealistic or negative thoughts and behaviors and to replace them with positive ones. ? Acceptance and commitment therapy (ACT). This treatment teaches people how to be mindful as a way to cope with unwanted thoughts and feelings. ? Biofeedback. This process trains you to   you to manage your body's response (physiological response) through breathing techniques and relaxation methods. You will work with a therapist while machines are used to monitor your physical symptoms.  Stress  management techniques. These include yoga, meditation, and exercise.  A mental health specialist can help determine which treatment is best for you. Some people see improvement with one type of therapy. However, other people require a combination of therapies. Follow these instructions at home:  Take over-the-counter and prescription medicines only as told by your health care provider.  Try to maintain a normal routine.  Try to anticipate stressful situations and allow extra time to manage them.  Practice any stress management or self-calming techniques as taught by your health care provider.  Do not punish yourself for setbacks or for not making progress.  Try to recognize your accomplishments, even if they are small.  Keep all follow-up visits as told by your health care provider. This is important. Contact a health care provider if:  Your symptoms do not get better.  Your symptoms get worse.  You have signs of depression, such as: ? A persistently sad, cranky, or irritable mood. ? Loss of enjoyment in activities that used to bring you joy. ? Change in weight or eating. ? Changes in sleeping habits. ? Avoiding friends or family members. ? Loss of energy for normal tasks. ? Feelings of guilt or worthlessness. Get help right away if:  You have serious thoughts about hurting yourself or others. If you ever feel like you may hurt yourself or others, or have thoughts about taking your own life, get help right away. You can go to your nearest emergency department or call:  Your local emergency services (911 in the [place]  A suicide crisis helpline, such as the National Suicide Prevention Lifeline at 1-800-273-8255. This is open 24 hours a day.  Summary  Generalized anxiety disorder (GAD) is a mental health disorder that involves worry that is not triggered by a specific event.  People with GAD often worry excessively about many things in their lives, such as their health and  family.  GAD may cause physical symptoms such as restlessness, trouble concentrating, sleep problems, frequent sweating, nausea, diarrhea, headaches, and trembling or muscle twitching.  A mental health specialist can help determine which treatment is best for you. Some people see improvement with one type of therapy. However, other people require a combination of therapies. This information is not intended to replace advice given to you by your health care provider. Make sure you discuss any questions you have with your health care provider. Document Released: 06/13/2012 Document Revised: 01/07/2016 Document Reviewed: 01/07/2016 Elsevier Interactive Patient Education  2018 Elsevier Inc.      Living With Anxiety  After being diagnosed with an anxiety disorder, you may be relieved to know why you have felt or behaved a certain way. It is natural to also feel overwhelmed about the treatment ahead and what it will mean for your life. With care and support, you can manage this condition and recover from it. How to cope with anxiety Dealing with stress Stress is your body's reaction to life changes and events, both good and bad. Stress can last just a few hours or it can be ongoing. Stress can play a major role in anxiety, so it is important to learn both how to cope with stress and how to think about it differently. Talk with your health care provider or a counselor to learn more about stress reduction. He   or she may suggest some stress reduction techniques, such as:  Music therapy. This can include creating or listening to music that you enjoy and that inspires you.  Mindfulness-based meditation. This involves being aware of your normal breaths, rather than trying to control your breathing. It can be done while sitting or walking.  Centering prayer. This is a kind of meditation that involves focusing on a word, phrase, or sacred image that is meaningful to you and that brings you peace.  Deep  breathing. To do this, expand your stomach and inhale slowly through your nose. Hold your breath for 3-5 seconds. Then exhale slowly, allowing your stomach muscles to relax.  Self-talk. This is a skill where you identify thought patterns that lead to anxiety reactions and correct those thoughts.  Muscle relaxation. This involves tensing muscles then relaxing them.  Choose a stress reduction technique that fits your lifestyle and personality. Stress reduction techniques take time and practice. Set aside 5-15 minutes a day to do them. Therapists can offer training in these techniques. The training may be covered by some insurance plans. Other things you can do to manage stress include:  Keeping a stress diary. This can help you learn what triggers your stress and ways to control your response.  Thinking about how you respond to certain situations. You may not be able to control everything, but you can control your reaction.  Making time for activities that help you relax, and not feeling guilty about spending your time in this way.  Therapy combined with coping and stress-reduction skills provides the best chance for successful treatment. Medicines Medicines can help ease symptoms. Medicines for anxiety include:  Anti-anxiety drugs.  Antidepressants.  Beta-blockers.  Medicines may be used as the main treatment for anxiety disorder, along with therapy, or if other treatments are not working. Medicines should be prescribed by a health care provider. Relationships Relationships can play a big part in helping you recover. Try to spend more time connecting with trusted friends and family members. Consider going to couples counseling, taking family education classes, or going to family therapy. Therapy can help you and others better understand the condition. How to recognize changes in your condition Everyone has a different response to treatment for anxiety. Recovery from anxiety happens when  symptoms decrease and stop interfering with your daily activities at home or work. This may mean that you will start to:  Have better concentration and focus.  Sleep better.  Be less irritable.  Have more energy.  Have improved memory.  It is important to recognize when your condition is getting worse. Contact your health care provider if your symptoms interfere with home or work and you do not feel like your condition is improving. Where to find help and support: You can get help and support from these sources:  Self-help groups.  Online and community organizations.  A trusted spiritual leader.  Couples counseling.  Family education classes.  Family therapy.  Follow these instructions at home:  Eat a healthy diet that includes plenty of vegetables, fruits, whole grains, low-fat dairy products, and lean protein. Do not eat a lot of foods that are high in solid fats, added sugars, or salt.  Exercise. Most adults should do the following: ? Exercise for at least 150 minutes each week. The exercise should increase your heart rate and make you sweat (moderate-intensity exercise). ? Strengthening exercises at least twice a week.  Cut down on caffeine, tobacco, alcohol, and other potentially harmful substances.    sleep. Most adults need 7-9 hours of sleep each night.  Make choices that simplify your life.  Take over-the-counter and prescription medicines only as told by your health care provider.  Avoid caffeine, alcohol, and certain over-the-counter cold medicines. These may make you feel worse. Ask your pharmacist which medicines to avoid.  Keep all follow-up visits as told by your health care provider. This is important. Questions to ask your health care provider  Would I benefit from therapy?  How often should I follow up with a health care provider?  How long do I need to take medicine?  Are there any long-term side effects of my  medicine?  Are there any alternatives to taking medicine? Contact a health care provider if:  You have a hard time staying focused or finishing daily tasks.  You spend many hours a day feeling worried about everyday life.  You become exhausted by worry.  You start to have headaches, feel tense, or have nausea.  You urinate more than normal.  You have diarrhea. Get help right away if:  You have a racing heart and shortness of breath.  You have thoughts of hurting yourself or others. If you ever feel like you may hurt yourself or others, or have thoughts about taking your own life, get help right away. You can go to your nearest emergency department or call:  Your local emergency services (911 in the [place]  A suicide crisis helpline, such as the National Suicide Prevention Lifeline at (870)759-94041-(831)521-2539. This is open 24-hours a day.  Summary  Taking steps to deal with stress can help calm you.  Medicines cannot cure anxiety disorders, but they can help ease symptoms.  Family, friends, and partners can play a big part in helping you recover from an anxiety disorder. This information is not intended to replace advice given to you by your health care provider. Make sure you discuss any questions you have with your health care provider. Document Released: 02/11/2016 Document Revised: 02/11/2016 Document Reviewed: 02/11/2016 Elsevier Interactive Patient Education  2018 [employer]  Constipation, Adult Constipation is when a person:  Poops (has a bowel movement) fewer times in a week than normal.  Has a hard time pooping.  Has poop that is dry, hard, or bigger than normal.  Follow these instructions at home: Eating and drinking   Eat foods that have a lot of fiber, such as: ? Fresh fruits and vegetables. ? Whole grains. ? Beans.  Eat less of foods that are high in fat, low in fiber, or overly processed, such as: ? [place]  fries. ? Hamburgers. ? Cookies. ? Candy. ? Soda.  Drink enough fluid to keep your pee (urine) clear or pale yellow. General instructions  Exercise regularly or as told by your doctor.  Go to the restroom when you feel like you need to poop. Do not hold it in.  Take over-the-counter and prescription medicines only as told by your doctor. These include any fiber supplements.  Do pelvic floor retraining exercises, such as: ? Doing deep breathing while relaxing your lower belly (abdomen). ? Relaxing your pelvic floor while pooping.  Watch your condition for any changes.  Keep all follow-up visits as told by your doctor. This is important. Contact a doctor if:  You have pain that gets worse.  You have a fever.  You have not pooped for 4 days.  You throw up (vomit).  You are not hungry.  You lose weight.  You are bleeding from the  anus.  You have thin, pencil-like poop (stool). Get help right away if:  You have a fever, and your symptoms suddenly get worse.  You leak poop or have blood in your poop.  Your belly feels hard or bigger than normal (is bloated).  You have very bad belly pain.  You feel dizzy or you faint. This information is not intended to replace advice given to you by your health care provider. Make sure you discuss any questions you have with your health care provider. Document Released: 08/05/2007 Document Revised: 09/06/2015 Document Reviewed: 08/07/2015 Elsevier Interactive Patient Education  2018 [employer].

## 2017-08-28 LAB — CMP14+EGFR
ALBUMIN: 4.2 g/dL (ref 3.5–5.5)
ALT: 22 IU/L (ref 0–44)
AST: 22 IU/L (ref 0–40)
Albumin/Globulin Ratio: 1.8 (ref 1.2–2.2)
Alkaline Phosphatase: 61 IU/L (ref 39–117)
BILIRUBIN TOTAL: 0.6 mg/dL (ref 0.0–1.2)
BUN / CREAT RATIO: 18 (ref 9–20)
BUN: 14 mg/dL (ref 6–20)
CHLORIDE: 105 mmol/L (ref 96–106)
CO2: 21 mmol/L (ref 20–29)
Calcium: 9.1 mg/dL (ref 8.7–10.2)
Creatinine, Ser: 0.79 mg/dL (ref 0.76–1.27)
GFR calc non Af Amer: 116 mL/min/{1.73_m2} (ref >59)
GFR, EST AFRICAN AMERICAN: 134 mL/min/{1.73_m2} (ref >59)
GLUCOSE: 128 mg/dL — AB (ref 65–99)
Globulin, Total: 2.4 g/dL (ref 1.5–4.5)
Potassium: 4 mmol/L (ref 3.5–5.2)
Sodium: 141 mmol/L (ref 134–144)
TOTAL PROTEIN: 6.6 g/dL (ref 6.0–8.5)

## 2017-08-28 LAB — CBC
HEMOGLOBIN: 15 g/dL (ref 13.0–17.7)
Hematocrit: 42.9 % (ref 37.5–51.0)
MCH: 31.2 pg (ref 26.6–33.0)
MCHC: 35 g/dL (ref 31.5–35.7)
MCV: 89 fL (ref 79–97)
Platelets: 267 10*3/uL (ref 150–450)
RBC: 4.81 x10E6/uL (ref 4.14–5.80)
RDW: 14.3 % (ref 12.3–15.4)
WBC: 7.7 10*3/uL (ref 3.4–10.8)

## 2017-08-28 LAB — TSH: TSH: 0.323 u[IU]/mL — ABNORMAL LOW (ref 0.450–4.500)

## 2017-08-28 LAB — LIPID PANEL
Chol/HDL Ratio: 6.1 ratio — ABNORMAL HIGH (ref 0.0–5.0)
Cholesterol, Total: 206 mg/dL — ABNORMAL HIGH (ref 100–199)
HDL: 34 mg/dL — ABNORMAL LOW (ref >39)
LDL CALC: 135 mg/dL — AB (ref 0–99)
Triglycerides: 186 mg/dL — ABNORMAL HIGH (ref 0–149)
VLDL CHOLESTEROL CAL: 37 mg/dL (ref 5–40)

## 2017-08-28 LAB — HEMOGLOBIN A1C
ESTIMATED AVERAGE GLUCOSE: 111 mg/dL
HEMOGLOBIN A1C: 5.5 % (ref 4.8–5.6)

## 2017-09-03 ENCOUNTER — Telehealth: Payer: Self-pay

## 2017-09-03 ENCOUNTER — Telehealth: Payer: Self-pay | Admitting: Nurse Practitioner

## 2017-09-03 NOTE — Telephone Encounter (Signed)
-----   Message from Claiborne RiggZelda W Fleming, NP sent at 08/29/2017  1:58 PM EDT ----- Thyroid level is low which can sometimes mean an overactive thyroid. Will rechecked in 3-4 weeks or at your next office visit. Tests also show increased cholesterol/lipid levels. Make sure you are drinking at least 48 oz of water per day. Work on eating a low fat, heart healthy diet and participate in regular aerobic exercise program to control as well. Exercise at least  30 minutes per day-5 days per week. Avoid red meat. No fried foods. No junk foods, sodas, sugary foods or drinks, unhealthy snacking, alcohol or smoking.

## 2017-09-03 NOTE — Telephone Encounter (Signed)
CMA attempt to call patient to inform on lab results.  No answer and unable to leave a VM for patient due to no VM was set up.  A letter will be send out.

## 2017-09-03 NOTE — Telephone Encounter (Signed)
Noted. Thank You.

## 2017-09-03 NOTE — Telephone Encounter (Signed)
Patient called and Nurse gave him lab results

## 2017-09-24 ENCOUNTER — Ambulatory Visit: Payer: Self-pay | Attending: Nurse Practitioner | Admitting: Nurse Practitioner

## 2017-09-24 ENCOUNTER — Ambulatory Visit: Payer: Self-pay | Attending: Nurse Practitioner

## 2017-09-24 ENCOUNTER — Encounter: Payer: Self-pay | Admitting: Nurse Practitioner

## 2017-09-24 VITALS — BP 123/79 | HR 68 | Temp 98.0°F | Ht 74.0 in | Wt 232.6 lb

## 2017-09-24 DIAGNOSIS — Z833 Family history of diabetes mellitus: Secondary | ICD-10-CM | POA: Insufficient documentation

## 2017-09-24 DIAGNOSIS — R7989 Other specified abnormal findings of blood chemistry: Secondary | ICD-10-CM | POA: Insufficient documentation

## 2017-09-24 DIAGNOSIS — Z0000 Encounter for general adult medical examination without abnormal findings: Secondary | ICD-10-CM | POA: Insufficient documentation

## 2017-09-24 NOTE — Progress Notes (Signed)
Assessment & Plan:  Kevin [name] was seen today for annual exam.  Diagnoses and all orders for this visit:  Encounter for annual physical exam Follow up in 1 year  Abnormal TSH -     TSH -     T4, Free -     T3    Patient has been counseled on age-appropriate routine health concerns for screening and prevention. These are reviewed and up-to-date. Referrals have been placed accordingly. Immunizations are up-to-date or declined.    Subjective:   Chief Complaint  Patient presents with  . Annual Exam    Patient is here for a physical.    HPI Kevin [name] 37 y.o. male presents to office today   Review of Systems  Constitutional: Negative for fever, malaise/fatigue and weight loss.  HENT: Negative for nosebleeds and tinnitus.   Eyes: Negative.  Negative for blurred vision, double vision and photophobia.  Respiratory: Negative.  Negative for cough and shortness of breath.   Cardiovascular: Negative.  Negative for chest pain, palpitations and leg swelling.  Gastrointestinal: Positive for constipation (improved with docusate). Negative for heartburn, nausea and vomiting.  Genitourinary: Negative.   Musculoskeletal: Negative.  Negative for myalgias.  Skin: Negative.   Neurological: Negative.  Negative for dizziness, focal weakness, seizures and headaches.  Endo/Heme/Allergies: Negative.   Psychiatric/Behavioral: Negative.  Negative for suicidal ideas.    Past Medical History:  Diagnosis Date  . Recurrent upper respiratory infection (URI)     Past Surgical History:  Procedure Laterality Date  . ANKLE SURGERY    . FRACTURE SURGERY  2003   PLATE IN RIGHT ANKLE--FX  . TONSILLECTOMY  05/11/2011   Procedure: TONSILLECTOMY;  Surgeon: Christia Reading, MD;  Location: [facility] OR;  Service: ENT;  Laterality: N/A;    Family History  Problem Relation Age of Onset  . Diabetes Mother     Social History Reviewed with no changes to be made today.   Outpatient Medications Prior to Visit    Medication Sig Dispense Refill  . docusate sodium (COLACE) 50 MG capsule Take 1 capsule (50 mg total) by mouth 2 (two) times daily. 60 capsule 1   No facility-administered medications prior to visit.     No Known Allergies     Objective:    BP 123/79 (BP Location: Left Arm, Patient Position: Sitting, Cuff Size: Large)   Pulse 68   Temp 98 F (36.7 C) (Oral)   Ht 6\' 2"  (1.88 m)   Wt 232 lb 9.6 oz (105.5 kg)   SpO2 94%   BMI 29.86 kg/m  Wt Readings from Last 3 Encounters:  09/24/17 232 lb 9.6 oz (105.5 kg)  08/27/17 234 lb 12.8 oz (106.5 kg)  04/13/15 228 lb (103.4 kg)    Physical Exam  Constitutional: Kevin [name] is oriented to person, place, and time. Kevin [name] appears well-developed and well-nourished.  HENT:  Head: Normocephalic and atraumatic.  Right Ear: Hearing, tympanic membrane, external ear and ear canal normal.  Left Ear: Hearing, tympanic membrane, external ear and ear canal normal.  Nose: No mucosal edema or rhinorrhea.  Mouth/Throat: Uvula is midline, oropharynx is clear and moist and mucous membranes are normal. Abnormal dentition. Tonsils are 1+ on the right. Tonsils are 1+ on the left. No tonsillar exudate.  Eyes: Pupils are equal, round, and reactive to light. Conjunctivae, EOM and lids are normal. No scleral icterus.  Neck: Normal range of motion. Neck supple. No tracheal deviation present. No thyromegaly present.  Cardiovascular: Normal rate, regular rhythm,  normal heart sounds and intact distal pulses. Exam reveals no gallop and no friction rub.  No murmur heard. Pulses:      Dorsalis pedis pulses are 2+ on the right side, and 2+ on the left side.       Posterior tibial pulses are 2+ on the right side, and 2+ on the left side.  Pulmonary/Chest: Effort normal and breath sounds normal. No respiratory distress. Kevin [name] has no wheezes. Kevin [name] has no rales. Kevin [name] exhibits no mass and no tenderness. Right breast exhibits no inverted nipple, no mass, no nipple discharge, no skin change and  no tenderness. Left breast exhibits no inverted nipple, no mass, no nipple discharge, no skin change and no tenderness.  Abdominal: Soft. Bowel sounds are normal. Kevin [name] exhibits no distension and no mass. There is no tenderness. There is no rebound and no guarding. Hernia confirmed negative in the right inguinal area and confirmed negative in the left inguinal area.  Genitourinary: Testes normal and penis normal. Right testis shows no mass, no swelling and no tenderness. Right testis is descended. Cremasteric reflex is not absent on the right side. Left testis shows no mass, no swelling and no tenderness. Left testis is descended. Cremasteric reflex is not absent on the left side. No penile tenderness.  Musculoskeletal: Normal range of motion. Kevin [name] exhibits no edema, tenderness or deformity.  Lymphadenopathy:    Kevin [name] has no cervical adenopathy. No inguinal adenopathy noted on the right or left side.       Right: No inguinal adenopathy present.       Left: No inguinal adenopathy present.  Neurological: Kevin [name] is alert and oriented to person, place, and time. Kevin [name] has normal strength. Kevin [name] displays normal reflexes. No cranial nerve deficit or sensory deficit. Kevin [name] exhibits normal muscle tone. Kevin [name] displays a negative Romberg sign. Coordination and gait normal.  Skin: Skin is warm and dry. Capillary refill takes less than 2 seconds. No rash noted. No erythema.  Psychiatric: Kevin [name] has a normal mood and affect. His behavior is normal. Judgment and thought content normal.      Patient has been counseled extensively about nutrition and exercise as well as the importance of adherence with medications and regular follow-up. The patient was given clear instructions to go to ER or return to medical center if symptoms don't improve, worsen or new problems develop. The patient verbalized understanding.   Follow-up: Return if symptoms worsen or fail to improve.   Claiborne RiggZelda W Karem Tomaso, FNP-BC [facility]  [place], [place] [phone]   09/24/2017, 4:31 PM

## 2017-09-25 LAB — T3: T3 TOTAL: 119 ng/dL (ref 71–180)

## 2017-09-25 LAB — T4, FREE: FREE T4: 1.57 ng/dL (ref 0.82–1.77)

## 2017-09-25 LAB — TSH: TSH: 0.549 u[IU]/mL (ref 0.450–4.500)

## 2017-09-30 ENCOUNTER — Telehealth: Payer: Self-pay

## 2017-09-30 NOTE — Telephone Encounter (Signed)
-----   Message from Claiborne RiggZelda W Fleming, NP sent at 09/27/2017  8:52 AM EDT ----- Thyroid levels are all normal at this time]

## 2017-09-30 NOTE — Telephone Encounter (Signed)
CMA spoke to patient to inform on lab results.  Patient verified DOB. Patient understood.  

## 2019-04-19 ENCOUNTER — Ambulatory Visit (HOSPITAL_COMMUNITY)
Admission: EM | Admit: 2019-04-19 | Discharge: 2019-04-19 | Disposition: A | Discharge status: Home / Self Care | Payer: Self-pay | Attending: Urgent Care | Admitting: Urgent Care

## 2019-04-19 ENCOUNTER — Encounter (HOSPITAL_COMMUNITY): Payer: Self-pay

## 2019-04-19 ENCOUNTER — Ambulatory Visit (INDEPENDENT_AMBULATORY_CARE_PROVIDER_SITE_OTHER): Payer: Self-pay

## 2019-04-19 ENCOUNTER — Other Ambulatory Visit: Payer: Self-pay

## 2019-04-19 DIAGNOSIS — S20211A Contusion of right front wall of thorax, initial encounter: Secondary | ICD-10-CM

## 2019-04-19 DIAGNOSIS — S2231XA Fracture of one rib, right side, initial encounter for closed fracture: Secondary | ICD-10-CM

## 2019-04-19 DIAGNOSIS — R0789 Other chest pain: Secondary | ICD-10-CM

## 2019-04-19 DIAGNOSIS — M546 Pain in thoracic spine: Secondary | ICD-10-CM

## 2019-04-19 MED ORDER — NAPROXEN 500 MG PO TABS: 500.0000 mg | ORAL_TABLET | Freq: Two times a day (BID) | ORAL | 0 refills | Status: AC

## 2019-04-19 MED ORDER — TIZANIDINE HCL 4 MG PO TABS: 4.0000 mg | ORAL_TABLET | Freq: Four times a day (QID) | ORAL | 0 refills | Status: AC | PRN

## 2019-04-19 MED ORDER — HYDROCODONE-ACETAMINOPHEN 5-325 MG PO TABS: 2.0000 | ORAL_TABLET | ORAL | 0 refills | Status: AC | PRN
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
## 2019-11-13 ENCOUNTER — Other Ambulatory Visit: Payer: Self-pay

## 2019-11-13 ENCOUNTER — Ambulatory Visit: Payer: Self-pay | Attending: Family Medicine
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
